# Patient Record
Sex: Male | Born: 2000
Health system: Southern US, Community
[De-identification: ages and names within clinical notes are randomized; demographics above are authoritative.]

## PROBLEM LIST (undated history)

## (undated) ENCOUNTER — Emergency Department (HOSPITAL_BASED_OUTPATIENT_CLINIC_OR_DEPARTMENT_OTHER): Discharge status: Absent without leave | Payer: BLUE CROSS/BLUE SHIELD

## (undated) DIAGNOSIS — J45909 Unspecified asthma, uncomplicated: Secondary | ICD-10-CM

## (undated) DIAGNOSIS — K4090 Unilateral inguinal hernia, without obstruction or gangrene, not specified as recurrent: Secondary | ICD-10-CM

---
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
## 2019-03-05 ENCOUNTER — Telehealth: Payer: Self-pay | Admitting: Adult Health

## 2019-03-05 MED ORDER — ALBUTEROL SULFATE HFA 108 (90 BASE) MCG/ACT IN AERS: INHALATION_SPRAY | RESPIRATORY_TRACT | 5 refills | Status: DC

## 2019-03-05 NOTE — Telephone Encounter (Signed)
Document created under Parrett NP's name in error Patient belongs to Dr Sandrea Hughs Received call from patient's mother Steward Drone requesting refill on patient's Proair [facility] [place], [address] Refills sent

## 2019-06-06 ENCOUNTER — Telehealth: Payer: Self-pay | Admitting: Adult Health

## 2019-06-06 MED ORDER — BUDESONIDE-FORMOTEROL FUMARATE 160-4.5 MCG/ACT IN AERO: 2.0000 | INHALATION_SPRAY | Freq: Two times a day (BID) | RESPIRATORY_TRACT | 1 refills | Status: DC

## 2019-06-06 NOTE — Telephone Encounter (Signed)
Pt mother called, needs refill of symbicort  Doing well .  Last seen 06/2018 ,  Advised will refill x 2 , needs ov in late July , early August with Dr. Melvyn Novas     Please refill Symbicort x 2  Set up visit with Dr. Melvyn Novas

## 2019-06-06 NOTE — Telephone Encounter (Signed)
Spoke with pt's mom and she states the pt now lives in [place] and wanted to know if he could do a televisit. I advised her that eventually he would need to be seen in the office and may have to establish care in [place]. SHe has to verify the pharmacy with the pt but will have to call me back with that information. Will await a return call.

## 2019-06-06 NOTE — Telephone Encounter (Signed)
Mom called back to give pharmacy . I sent in Symbicort to his pharmacy.  [facility] [address] [phone]  She will call back to make a televisit when she speaks to pt.

## 2019-06-19 ENCOUNTER — Telehealth: Payer: Self-pay | Admitting: Adult Health

## 2019-06-19 ENCOUNTER — Other Ambulatory Visit: Payer: Self-pay | Admitting: Adult Health

## 2019-06-19 MED ORDER — BUDESONIDE-FORMOTEROL FUMARATE 160-4.5 MCG/ACT IN AERO: 2.0000 | INHALATION_SPRAY | Freq: Two times a day (BID) | RESPIRATORY_TRACT | 0 refills | Status: DC

## 2019-06-19 NOTE — Telephone Encounter (Signed)
Refill of symbicort , need 3 month supply for insurance purposes .

## 2019-06-22 ENCOUNTER — Telehealth: Payer: Self-pay | Admitting: Adult Health

## 2019-06-22 MED ORDER — BUDESONIDE-FORMOTEROL FUMARATE 160-4.5 MCG/ACT IN AERO: 2.0000 | INHALATION_SPRAY | Freq: Two times a day (BID) | RESPIRATORY_TRACT | 0 refills | Status: DC

## 2019-06-22 NOTE — Telephone Encounter (Signed)
Call from mother 3 month supply did not go thru at pharmacy , will resend

## 2019-08-20 ENCOUNTER — Ambulatory Visit: Payer: BLUE CROSS/BLUE SHIELD | Admitting: Adult Health

## 2019-08-20 ENCOUNTER — Encounter: Payer: Self-pay | Admitting: Adult Health

## 2019-08-20 ENCOUNTER — Other Ambulatory Visit: Payer: Self-pay

## 2019-08-20 DIAGNOSIS — J309 Allergic rhinitis, unspecified: Secondary | ICD-10-CM | POA: Insufficient documentation

## 2019-08-20 DIAGNOSIS — J302 Other seasonal allergic rhinitis: Secondary | ICD-10-CM

## 2019-08-20 DIAGNOSIS — Z23 Encounter for immunization: Secondary | ICD-10-CM

## 2019-08-20 DIAGNOSIS — J4540 Moderate persistent asthma, uncomplicated: Secondary | ICD-10-CM | POA: Diagnosis not present

## 2019-08-20 MED ORDER — BUDESONIDE-FORMOTEROL FUMARATE 160-4.5 MCG/ACT IN AERO: 2.0000 | INHALATION_SPRAY | Freq: Two times a day (BID) | RESPIRATORY_TRACT | 4 refills | Status: DC

## 2019-08-20 MED ORDER — ALBUTEROL SULFATE HFA 108 (90 BASE) MCG/ACT IN AERS: INHALATION_SPRAY | RESPIRATORY_TRACT | 4 refills | Status: DC

## 2019-08-20 NOTE — Patient Instructions (Addendum)
Continue on Symbicort 2 puffs Twice daily  , rinse after use. Zyrtec 10mg  At bedtime  As needed  Drainage .  ProAir 2 puffs every 4hrs as needed wheezing .  Follow up in 1 year with Dr. Melvyn Novas  Or Kerney Hopfensperger NP and As needed

## 2019-08-20 NOTE — Assessment & Plan Note (Signed)
Continue trigger prevention.  May use Zyrtec daily as needed  Plan Patient Instructions  Continue on Symbicort 2 puffs Twice daily  , rinse after use. Zyrtec 10mg  At bedtime  As needed  Drainage .  ProAir 2 puffs every 4hrs as needed wheezing .  Follow up in 1 year with Dr. Melvyn Novas  Or Azaleah Usman NP and As needed

## 2019-08-20 NOTE — Progress Notes (Signed)
_0  ID: [name], male    DOB: August 04, 2001, 18 y.o.   MRN: [id number]  Chief Complaint  Patient presents with  . Follow-up    Asthma     Referring provider: Alfonse Ras, MD  HPI: 18 year old male never smoker followed for moderate persistent asthma.  Initial pulmonary consult July 25, 2018  Childhood asthma history  TEST/EVENTS :  Spirometry 07/25/2018  FEV1 3/87 (87%)  Ratio 79 s curvature s rx prior - FENO 07/25/2018  =   116   08/20/2019 Follow up : Asthma  Patient returns for a one-year follow-up.  Patient has underlying moderate persistent asthma.  He is a never smoker.   He has had asthma since early childhood.  Patient remains on Symbicort twice daily.  Says since starting this he is doing better he has no nocturnal cough anymore.  Activity tolerance is good.  He is able to exercise.  Patient says he has been doing well without any flare of cough and wheezing.  Patient has moved to [place].  Says he climate there is better for his breathing.  Has rare albuterol use.   No Known Allergies  Immunization History  Administered Date(s) Administered  . DTaP 03/22/2000, 05/25/2000, 08/19/2000, 10/03/2001, 02/17/2005  . HPV Quadrivalent 10/18/2013, 01/29/2014, 09/24/2014  . Hepatitis A 08/13/2015, 10/27/2016  . Hepatitis B Sep [birth year], 2000, 02/22/2000, 08/19/2000  . HiB (PRP-T) 03/22/2000, 05/25/2000, 08/19/2000, 11/10/2001  . IPV 03/22/2000, 05/25/2000, 10/03/2001, 02/17/2005  . Influenza,inj,Quad PF,6+ Mos 10/27/2016  . Influenza-Unspecified 11/07/2000, 11/10/2001, 11/05/2015  . MMR 03/17/2001, 02/17/2005  . Meningococcal Conjugate 10/27/2016  . Meningococcal Polysaccharide 09/26/2013  . Pneumococcal-Unspecified 03/22/2000, 05/25/2000, 08/19/2000, 02/15/2002  . Tdap 07/23/2011  . Varicella 03/17/2001, 10/23/2009    Past Medical History:  Diagnosis Date  . Asthma    prn inhaler  . Inguinal hernia 04/2015   left    Tobacco History: Social History   Tobacco  Use  Smoking Status Never Smoker  Smokeless Tobacco Never Used   Counseling given: Not Answered   Outpatient Medications Prior to Visit  Medication Sig Dispense Refill  . albuterol (PROAIR HFA) 108 (90 Base) MCG/ACT inhaler 2 puffs every 4 hours as needed only  if your can't catch your breath 1 Inhaler 5  . budesonide-formoterol (SYMBICORT) 160-4.5 MCG/ACT inhaler Inhale 2 puffs into the lungs 2 (two) times daily. 1 Inhaler 1  . budesonide-formoterol (SYMBICORT) 160-4.5 MCG/ACT inhaler Inhale 2 puffs into the lungs 2 (two) times daily. (Patient not taking: Reported on 08/20/2019) 3 Inhaler 0   No facility-administered medications prior to visit.      Review of Systems:   Constitutional:   No  weight loss, night sweats,  Fevers, chills, fatigue, or  lassitude.  HEENT:   No headaches,  Difficulty swallowing,  Tooth/dental problems, or  Sore throat,                No sneezing, itching, ear ache,  +nasal congestion, post nasal drip,   CV:  No chest pain,  Orthopnea, PND, swelling in lower extremities, anasarca, dizziness, palpitations, syncope.   GI  No heartburn, indigestion, abdominal pain, nausea, vomiting, diarrhea, change in bowel habits, loss of appetite, bloody stools.   Resp: No shortness of breath with exertion or at rest.  No excess mucus, no productive cough,  No non-productive cough,  No coughing up of blood.  No change in color of mucus.  No wheezing.  No chest wall deformity  Skin: no rash or lesions.  GU: no  dysuria, change in color of urine, no urgency or frequency.  No flank pain, no hematuria   MS:  No joint pain or swelling.  No decreased range of motion.  No back pain.    Physical Exam  BP 110/76 (BP Location: Left Arm, Cuff Size: Normal)   Pulse 74   Temp 98 F (36.7 C) (Oral)   Ht 5' 8" (1.727 m)   Wt 195 lb 6.4 oz (88.6 kg)   SpO2 98%   BMI 29.71 kg/m   GEN: A/Ox3; pleasant , NAD, well nourished    HEENT:  [place]/AT,  EACs-clear, TMs-wnl, NOSE-clear,  THROAT-clear, no lesions, no postnasal drip or exudate noted.   NECK:  Supple w/ fair ROM; no JVD; normal carotid impulses w/o bruits; no thyromegaly or nodules palpated; no lymphadenopathy.    RESP  Clear  P & A; w/o, wheezes/ rales/ or rhonchi. no accessory muscle use, no dullness to percussion  CARD:  RRR, no m/r/g, no peripheral edema, pulses intact, no cyanosis or clubbing.  GI:   Soft & nt; nml bowel sounds; no organomegaly or masses detected.   Musco: Warm bil, no deformities or joint swelling noted.   Neuro: alert, no focal deficits noted.    Skin: Warm, no lesions or rashes    Lab Results:  CBC No results found for: WBC, RBC, HGB, HCT, PLT, MCV, MCH, MCHC, RDW, LYMPHSABS, MONOABS, EOSABS, BASOSABS  BMET No results found for: NA, K, CL, CO2, GLUCOSE, BUN, CREATININE, CALCIUM, GFRNONAA, GFRAA  BNP No results found for: BNP  ProBNP No results found for: PROBNP  Imaging: No results found.    No flowsheet data found.  Lab Results  Component Value Date   NITRICOXIDE 116 07/25/2018        Assessment & Plan:   Chronic asthma, moderate persistent, uncomplicated Currently well controlled  Plan  Patient Instructions  Continue on Symbicort 2 puffs Twice daily  , rinse after use. Zyrtec 10mg At bedtime  As needed  Drainage .  ProAir 2 puffs every 4hrs as needed wheezing .  Follow up in 1 year with Dr. Wert  Or Parrett NP and As needed       Allergic rhinitis Continue trigger prevention.  May use Zyrtec daily as needed  Plan Patient Instructions  Continue on Symbicort 2 puffs Twice daily  , rinse after use. Zyrtec 10mg At bedtime  As needed  Drainage .  ProAir 2 puffs every 4hrs as needed wheezing .  Follow up in 1 year with Dr. Wert  Or Parrett NP and As needed          Tammy Parrett, NP 08/20/2019  

## 2019-08-20 NOTE — Assessment & Plan Note (Signed)
Currently well controlled  Plan  Patient Instructions  Continue on Symbicort 2 puffs Twice daily  , rinse after use. Zyrtec 10mg  At bedtime  As needed  Drainage .  ProAir 2 puffs every 4hrs as needed wheezing .  Follow up in 1 year with Dr. Melvyn Novas  Or Parrett NP and As needed

## 2019-08-25 NOTE — Progress Notes (Signed)
Chart and office note reviewed in detail  > agree with a/p as outlined    

## 2019-09-07 ENCOUNTER — Other Ambulatory Visit: Payer: Self-pay | Admitting: Adult Health

## 2019-10-05 DIAGNOSIS — Z20828 Contact with and (suspected) exposure to other viral communicable diseases: Secondary | ICD-10-CM | POA: Diagnosis not present

## 2020-03-05 ENCOUNTER — Telehealth (INDEPENDENT_AMBULATORY_CARE_PROVIDER_SITE_OTHER): Payer: 59 | Admitting: Adult Health

## 2020-03-05 ENCOUNTER — Encounter: Payer: Self-pay | Admitting: Adult Health

## 2020-03-05 DIAGNOSIS — J302 Other seasonal allergic rhinitis: Secondary | ICD-10-CM | POA: Diagnosis not present

## 2020-03-05 DIAGNOSIS — J069 Acute upper respiratory infection, unspecified: Secondary | ICD-10-CM

## 2020-03-05 DIAGNOSIS — J4531 Mild persistent asthma with (acute) exacerbation: Secondary | ICD-10-CM | POA: Diagnosis not present

## 2020-03-05 MED ORDER — PREDNISONE 10 MG PO TABS: ORAL_TABLET | ORAL | 0 refills | Status: DC

## 2020-03-05 MED ORDER — AZITHROMYCIN 250 MG PO TABS
ORAL_TABLET | ORAL | 0 refills | Status: AC
Start: 2020-03-05 — End: 2020-03-10

## 2020-03-05 NOTE — Progress Notes (Signed)
Virtual Visit via Video Note  I connected with [name] on 03/05/20 at 11:30 AM EST by a video enabled telemedicine application and verified that I am speaking with the correct person using two identifiers.  Location: Patient: Home  Provider: Home    I discussed the limitations of evaluation and management by telemedicine and the availability of in person appointments. The patient expressed understanding and agreed to proceed.  History of Present Illness: 19 year old male never smoker followed for mild  persistent asthma  Today's video visit is for an acute office visit.  Patient states that 4 days ago he started getting nasal congestion ear pressure, cough thick mucus and wheezing with tightness.  Had to start using his albuterol inhaler.  Patient is maintained on Symbicort but states his inhaler ran out 1 day ago.  Patient has been working/stayig  in [place]  Patient says he was around someone there in [place] the had a sore throat.  Unknown if they had COVID-19.  [facility] states [facility] had [facility] COVID-19 test that was reported as negative.  Patient denies any fever, loss of taste or smell.  No chest pain no shortness of breath no calf pain or hemoptysis.    Patient Active Problem List   Diagnosis Date Noted  . Allergic rhinitis 08/20/2019  . Chronic asthma, moderate persistent, uncomplicated 29/92/4268   Current Outpatient Medications on File Prior to Visit  Medication Sig Dispense Refill  . albuterol (PROAIR HFA) 108 (90 Base) MCG/ACT inhaler 2 puffs every 4 hours as needed only  if your can't catch your breath 24 g 4  . SYMBICORT 160-4.5 MCG/ACT inhaler INHALE 2 PUFF INTO THE LUNGS 2 TIMES DAILY 30.6 Inhaler 6   No current facility-administered medications on file prior to visit.     Observations/Objective: Patient appears well no acute distress and no audible wheezing on video  visit   Spirometry 07/25/2018  FEV1 3/87 (87%)  Ratio 79 s curvature s rx prior - FENO 07/25/2018  =   116   Assessment and Plan: Acute upper respiratory infection with mild asthma flare. COVID-19 rapid test was negative however due to acute symptoms travel to [place] and possible exposure to sick contact .  Recommend him to get retested for COVID-19 Also recommend to self quarantine until test results are returned  Asthma -Symbicort refill was given continue on current regimen.  Would add in Zyrtec for possible trigger prevention .  Plan  Patient Instructions  Zpack take as directed.  Mucinex DM Twice daily  As needed  Cough/congestion  Zyrtec 10mg  At bedtime  For 5 days then As needed   Prednisone taper over next week  COVID 19 testing - [facility] (not [facility] ) or call us back if you want [place] to set up .  Follow up as planned with Dr. Melvyn Novas  And As needed   Please contact office for sooner follow up if symptoms do not improve or worsen or seek emergency care        Follow Up Instructions: Follow-up in 3 months and as needed   I discussed the assessment and treatment plan with the patient. The patient was provided an opportunity to ask questions and all were answered. The patient agreed with the plan and demonstrated an understanding of the instructions.   The patient was advised to call back or seek an in-person evaluation if the  symptoms worsen or if the condition fails to improve as anticipated.  I provided 22  minutes of non-face-to-face time during this encounter.   Tim Oaks, NP

## 2020-03-05 NOTE — Patient Instructions (Signed)
Zpack take as directed.  Mucinex DM Twice daily  As needed  Cough/congestion  Zyrtec 10mg  At bedtime  For 5 days then As needed   Prednisone taper over next week  COVID 19 [facility] - [facility] (not [facility] ) or call back if you want [place] to set up .  Follow up as planned with Dr. [place]  And As needed   Please contact office for sooner follow up if symptoms do not improve or worsen or seek emergency care

## 2020-08-26 ENCOUNTER — Telehealth: Payer: Self-pay | Admitting: Adult Health

## 2020-08-26 MED ORDER — BUDESONIDE-FORMOTEROL FUMARATE 160-4.5 MCG/ACT IN AERO: 2.0000 | INHALATION_SPRAY | Freq: Two times a day (BID) | RESPIRATORY_TRACT | 3 refills | Status: DC

## 2020-08-26 NOTE — Telephone Encounter (Signed)
Refill requested , sent in

## 2021-01-23 ENCOUNTER — Telehealth: Payer: Self-pay | Admitting: Adult Health

## 2021-01-23 MED ORDER — BUDESONIDE-FORMOTEROL FUMARATE 160-4.5 MCG/ACT IN AERO: 2.0000 | INHALATION_SPRAY | Freq: Two times a day (BID) | RESPIRATORY_TRACT | 3 refills | Status: DC

## 2021-01-23 MED ORDER — ALBUTEROL SULFATE HFA 108 (90 BASE) MCG/ACT IN AERS: INHALATION_SPRAY | RESPIRATORY_TRACT | 2 refills | Status: DC

## 2021-01-23 NOTE — Telephone Encounter (Signed)
Needs refills of inhalers sent to pharm , sent

## 2021-06-25 MED ORDER — BUDESONIDE-FORMOTEROL FUMARATE 160-4.5 MCG/ACT IN AERO: 2.0000 | INHALATION_SPRAY | Freq: Two times a day (BID) | RESPIRATORY_TRACT | 5 refills | Status: DC

## 2021-06-25 NOTE — Addendum Note (Signed)
Addended by: Delrae Rend on: 06/25/2021 11:52 AM   Modules accepted: Orders

## 2021-07-29 ENCOUNTER — Telehealth: Payer: Self-pay | Admitting: Adult Health

## 2021-07-29 MED ORDER — BUDESONIDE-FORMOTEROL FUMARATE 160-4.5 MCG/ACT IN AERO: 2.0000 | INHALATION_SPRAY | Freq: Two times a day (BID) | RESPIRATORY_TRACT | 3 refills | Status: DC

## 2021-07-29 MED ORDER — ALBUTEROL SULFATE HFA 108 (90 BASE) MCG/ACT IN AERS: 1.0000 | INHALATION_SPRAY | RESPIRATORY_TRACT | 2 refills | Status: DC | PRN

## 2021-07-29 NOTE — Telephone Encounter (Signed)
Refill sent to new pharm  

## 2021-09-20 ENCOUNTER — Telehealth: Payer: Self-pay | Admitting: Adult Health

## 2021-09-20 MED ORDER — PREDNISONE 10 MG PO TABS
ORAL_TABLET | ORAL | 0 refills | Status: DC
Start: 2021-09-20 — End: 2021-09-21

## 2021-09-20 NOTE — Telephone Encounter (Signed)
Called in with complaints x 1 week. Traveling from [place] due to [place] , Got exposed to water leak in bedroom , started to get cough/wheezing/sore throat . ST better but still has dry cough /wheezing . Taking symbicort and albuterol .  No fever , body aches.  Appettite is good. No n/v.   Rx Prednisone taper over next week  If not better ov .   Needs ov in few weeks with spirometry Russella Dar Vickie Epley with IgE/CBC , ? Add Singulair .  Escalate maintnence to decrease steroid use.  Patient will call when back in town from traveling.    Please contact office for sooner follow up if symptoms do not improve or worsen or seek emergency care

## 2021-09-21 ENCOUNTER — Telehealth: Payer: Self-pay | Admitting: Adult Health

## 2021-09-21 MED ORDER — PREDNISONE 10 MG PO TABS: ORAL_TABLET | ORAL | 0 refills | Status: DC

## 2021-09-21 NOTE — Telephone Encounter (Signed)
Called and needs [facility]. [facility] .

## 2022-03-02 ENCOUNTER — Other Ambulatory Visit: Payer: Self-pay | Admitting: Adult Health

## 2022-03-05 ENCOUNTER — Telehealth: Payer: Self-pay | Admitting: Adult Health

## 2022-03-05 MED ORDER — BUDESONIDE-FORMOTEROL FUMARATE 160-4.5 MCG/ACT IN AERO: 2.0000 | INHALATION_SPRAY | Freq: Two times a day (BID) | RESPIRATORY_TRACT | 5 refills | Status: DC

## 2022-03-05 MED ORDER — ALBUTEROL SULFATE HFA 108 (90 BASE) MCG/ACT IN AERS: 1.0000 | INHALATION_SPRAY | RESPIRATORY_TRACT | 3 refills | Status: DC | PRN

## 2022-03-05 NOTE — Telephone Encounter (Signed)
Refilled Symbicort and Albuterol inhaler  ?

## 2023-06-09 ENCOUNTER — Telehealth: Payer: Self-pay | Admitting: Adult Health

## 2023-06-09 MED ORDER — BUDESONIDE-FORMOTEROL FUMARATE 160-4.5 MCG/ACT IN AERO: 2.0000 | INHALATION_SPRAY | Freq: Two times a day (BID) | RESPIRATORY_TRACT | 3 refills | Status: DC

## 2023-06-09 NOTE — Telephone Encounter (Signed)
Wants refill of symbicort to mail order  Needs ov, advised to call office to make ov .

## 2023-06-22 ENCOUNTER — Other Ambulatory Visit: Payer: Self-pay | Admitting: Adult Health

## 2023-07-19 ENCOUNTER — Encounter: Payer: Self-pay | Admitting: Adult Health

## 2023-07-19 ENCOUNTER — Ambulatory Visit: Payer: 59 | Admitting: Adult Health

## 2023-07-19 VITALS — BP 126/60 | HR 82 | Temp 98.0°F | Ht 68.0 in | Wt 159.0 lb

## 2023-07-19 DIAGNOSIS — J302 Other seasonal allergic rhinitis: Secondary | ICD-10-CM | POA: Diagnosis not present

## 2023-07-19 DIAGNOSIS — J45909 Unspecified asthma, uncomplicated: Secondary | ICD-10-CM | POA: Insufficient documentation

## 2023-07-19 DIAGNOSIS — J4530 Mild persistent asthma, uncomplicated: Secondary | ICD-10-CM | POA: Diagnosis not present

## 2023-07-19 NOTE — Addendum Note (Signed)
Addended by: Delrae Rend on: 07/19/2023 02:10 PM   Modules accepted: Orders

## 2023-07-19 NOTE — Assessment & Plan Note (Signed)
Mild persistent asthma -doing well on current maintenance regimen  Trigger control  Asthma action plan  Check PFT on return   Plan  Patient Instructions  Continue on Symbicort 1-2 puffs Twice daily  , rinse after use. Zyrtec 10mg  At bedtime  As needed  Drainage .  Albuterol 1-2 puffs every 4hrs as needed wheezing .  Follow up in 1 year with PFT and As needed

## 2023-07-19 NOTE — Progress Notes (Signed)
@Patient  ID: [name], male    DOB: 2001/10/28, 22 y.o.   MRN: [id number]  Chief Complaint  Patient presents with   Follow-up    Referring provider: Chapman Moss, MD  HPI: 22 year old male never smoker followed for mild persistent asthma and allergic rhinitis   TEST/EVENTS :   07/19/2023 Follow up : Asthma and Allergic Rhinitis  Patient returns for a follow-up visit.  Last seen in March 2021.  Patient says overall asthma is doing well.  Denies flare of cough or wheezing.  Says he is very active.  Exercises every single day.  Travels back and forth between here and [place] for work.  Endorses compliance with Symbicort 1 puff twice daily.  No increased albuterol use.  Takes Zyrtec on occasion.  Says he feels good.  Being around certain dogs can flare asthma .    No Known Allergies  Immunization History  Administered Date(s) Administered   DTaP 03/22/2000, 05/25/2000, 08/19/2000, 10/03/2001, 02/17/2005   HIB (PRP-T) 03/22/2000, 05/25/2000, 08/19/2000, 11/10/2001   HPV Quadrivalent 10/18/2013, 01/29/2014, 09/24/2014   Hepatitis A 08/13/2015, 10/27/2016   Hepatitis A, Ped/Adol-2 Dose 08/13/2015, 10/27/2016   Hepatitis B 07-16-00, 02/22/2000, 08/19/2000   IPV 03/22/2000, 05/25/2000, 10/03/2001, 02/17/2005   Influenza Split 11/10/2001, 11/05/2015   Influenza,inj,Quad PF,6+ Mos 10/27/2016, 08/20/2019   Influenza,inj,Quad PF,6-35 Mos 11/07/2000   Influenza-Unspecified 11/07/2000, 11/10/2001, 11/05/2015   MMR 03/17/2001, 02/17/2005   Meningococcal Conjugate 10/27/2016   Meningococcal polysaccharide vaccine (MPSV4) 09/26/2013   Pneumococcal-Unspecified 03/22/2000, 05/25/2000, 08/19/2000, 02/15/2002   Tdap 07/23/2011   Varicella 03/17/2001, 10/23/2009    Past Medical History:  Diagnosis Date   Asthma    prn inhaler   Inguinal hernia 04/2015   left    Tobacco History: Social History   Tobacco Use  Smoking Status Never  Smokeless Tobacco Never    Counseling given: Not Answered   Outpatient Medications Prior to Visit  Medication Sig Dispense Refill   albuterol (VENTOLIN HFA) 108 (90 Base) MCG/ACT inhaler INHALE 1-2 PUFFS INTO THE LUNGS EVERY 4 HOURS AS NEEDED FOR WHEEZING OR SHORTNESS OF BREATH. 8.5 each 1   SYMBICORT 160-4.5 MCG/ACT inhaler INHALE 2 PUFFS INTO THE LUNGS TWICE A DAY 10.2 each 2   predniSONE (DELTASONE) 10 MG tablet 4 tabs for 2 days, then 3 tabs for 2 days, 2 tabs for 2 days, then 1 tab for 2 days, then stop (Patient not taking: Reported on 07/19/2023) 20 tablet 0   No facility-administered medications prior to visit.     Review of Systems:   Constitutional:   No  weight loss, night sweats,  Fevers, chills, fatigue, or  lassitude.  HEENT:   No headaches,  Difficulty swallowing,  Tooth/dental problems, or  Sore throat,                No sneezing, itching, ear ache, nasal congestion, post nasal drip,   CV:  No chest pain,  Orthopnea, PND, swelling in lower extremities, anasarca, dizziness, palpitations, syncope.   GI  No heartburn, indigestion, abdominal pain, nausea, vomiting, diarrhea, change in bowel habits, loss of appetite, bloody stools.   Resp: No shortness of breath with exertion or at rest.  No excess mucus, no productive cough,  No non-productive cough,  No coughing up of blood.  No change in color of mucus.  No wheezing.  No chest wall deformity  Skin: no rash or lesions.  GU: no dysuria, change in color of urine, no urgency or frequency.  No  flank pain, no hematuria   MS:  No joint pain or swelling.  No decreased range of motion.  No back pain.    Physical Exam  BP 126/60 (BP Location: Left Arm, Patient Position: Sitting, Cuff Size: Normal)   Pulse 82   Temp 98 F (36.7 C) (Oral)   Ht 5\' 8"  (1.727 m)   Wt 159 lb (72.1 kg)   SpO2 100%   BMI 24.18 kg/m   GEN: A/Ox3; pleasant , NAD, well nourished    HEENT:  Lane/AT,  NOSE-clear, THROAT-clear, no lesions, no postnasal drip or exudate  noted.   NECK:  Supple w/ fair ROM; no JVD; normal carotid impulses w/o bruits; no thyromegaly or nodules palpated; no lymphadenopathy.    RESP  Clear  P & A; w/o, wheezes/ rales/ or rhonchi. no accessory muscle use, no dullness to percussion  CARD:  RRR, no m/r/g, no peripheral edema, pulses intact, no cyanosis or clubbing.  GI:   Soft & nt; nml bowel sounds; no organomegaly or masses detected.   Musco: Warm bil, no deformities or joint swelling noted.   Neuro: alert, no focal deficits noted.    Skin: Warm, no lesions or rashes    Lab Results:  CBC No results found for: "WBC", "RBC", "HGB", "HCT", "PLT", "MCV", "MCH", "MCHC", "RDW", "LYMPHSABS", "MONOABS", "EOSABS", "BASOSABS"  BMET No results found for: "NA", "K", "CL", "CO2", "GLUCOSE", "BUN", "CREATININE", "CALCIUM", "GFRNONAA", "GFRAA"  BNP No results found for: "BNP"  ProBNP No results found for: "PROBNP"  Imaging: No results found.  Administration History     None           No data to display          Lab Results  Component Value Date   NITRICOXIDE 116 07/25/2018        Assessment & Plan:   Asthma Mild persistent asthma -doing well on current maintenance regimen  Trigger control  Asthma action plan  Check PFT on return   Plan  Patient Instructions  Continue on Symbicort 1-2 puffs Twice daily  , rinse after use. Zyrtec 10mg  At bedtime  As needed  Drainage .  Albuterol 1-2 puffs every 4hrs as needed wheezing .  Follow up in 1 year with PFT and As needed      Allergic rhinitis Under control -Zyrtec As needed       [name], NP 07/19/2023

## 2023-07-19 NOTE — Patient Instructions (Signed)
Continue on Symbicort 1-2 puffs Twice daily  , rinse after use. Zyrtec 10mg  At bedtime  As needed  Drainage .  Albuterol 1-2 puffs every 4hrs as needed wheezing .  Follow up in 1 year with PFT and As needed

## 2023-07-19 NOTE — Assessment & Plan Note (Signed)
Under control -Zyrtec As needed

## 2023-07-25 ENCOUNTER — Telehealth: Payer: Self-pay | Admitting: Adult Health

## 2023-07-25 MED ORDER — BUDESONIDE-FORMOTEROL FUMARATE 160-4.5 MCG/ACT IN AERO: 2.0000 | INHALATION_SPRAY | Freq: Two times a day (BID) | RESPIRATORY_TRACT | 11 refills | Status: DC

## 2023-07-25 NOTE — Telephone Encounter (Signed)
Refills sent per request.

## 2023-12-23 ENCOUNTER — Telehealth: Payer: Self-pay | Admitting: Adult Health

## 2023-12-23 MED ORDER — ALBUTEROL SULFATE (2.5 MG/3ML) 0.083% IN NEBU: 2.5000 mg | INHALATION_SOLUTION | Freq: Four times a day (QID) | RESPIRATORY_TRACT | 5 refills | Status: AC | PRN

## 2023-12-23 MED ORDER — BUDESONIDE-FORMOTEROL FUMARATE 160-4.5 MCG/ACT IN AERO: 2.0000 | INHALATION_SPRAY | Freq: Two times a day (BID) | RESPIRATORY_TRACT | 11 refills | Status: DC

## 2023-12-23 MED ORDER — ALBUTEROL SULFATE HFA 108 (90 BASE) MCG/ACT IN AERS: 1.0000 | INHALATION_SPRAY | Freq: Four times a day (QID) | RESPIRATORY_TRACT | 1 refills | Status: DC | PRN

## 2023-12-23 MED ORDER — OSELTAMIVIR PHOSPHATE 75 MG PO CAPS: 75.0000 mg | ORAL_CAPSULE | Freq: Two times a day (BID) | ORAL | 0 refills | Status: AC

## 2023-12-23 NOTE — Telephone Encounter (Signed)
1 day of Fever , body aches , cough and wheezing  Home flu test positive  Rec : Tamiflu x 5 days  Symbiicort , albuterol inhaler or nebs As needed   Symptomatic support , fluids and rest  Please contact office for sooner follow up if symptoms do not improve or worsen or seek emergency care

## 2024-06-25 ENCOUNTER — Telehealth: Payer: Self-pay | Admitting: Adult Health

## 2024-06-25 MED ORDER — ALBUTEROL SULFATE HFA 108 (90 BASE) MCG/ACT IN AERS: 1.0000 | INHALATION_SPRAY | Freq: Four times a day (QID) | RESPIRATORY_TRACT | 5 refills | Status: DC | PRN

## 2024-06-25 MED ORDER — BUDESONIDE-FORMOTEROL FUMARATE 160-4.5 MCG/ACT IN AERO: 2.0000 | INHALATION_SPRAY | Freq: Two times a day (BID) | RESPIRATORY_TRACT | 11 refills | Status: DC

## 2024-06-25 NOTE — Telephone Encounter (Signed)
 Requests inhaler refill , rx sent . Advised to keep 1 year follow up later this year as planned.

## 2024-10-15 ENCOUNTER — Telehealth: Payer: Self-pay | Admitting: Adult Health

## 2024-10-15 MED ORDER — ALBUTEROL SULFATE HFA 108 (90 BASE) MCG/ACT IN AERS: 1.0000 | INHALATION_SPRAY | Freq: Four times a day (QID) | RESPIRATORY_TRACT | 5 refills | Status: AC | PRN

## 2024-10-15 MED ORDER — BUDESONIDE-FORMOTEROL FUMARATE 160-4.5 MCG/ACT IN AERO: 2.0000 | INHALATION_SPRAY | Freq: Two times a day (BID) | RESPIRATORY_TRACT | 11 refills | Status: DC

## 2024-10-15 NOTE — Telephone Encounter (Signed)
 Patient is traveling needs refill of Symbicort  and Albuterol   Rx sent to pharm.  Advised to make ov when back from work .

## 2024-11-13 DIAGNOSIS — Z125 Encounter for screening for malignant neoplasm of prostate: Secondary | ICD-10-CM | POA: Diagnosis not present

## 2024-11-13 DIAGNOSIS — E539 Vitamin B deficiency, unspecified: Secondary | ICD-10-CM | POA: Diagnosis not present

## 2024-11-13 DIAGNOSIS — E079 Disorder of thyroid, unspecified: Secondary | ICD-10-CM | POA: Diagnosis not present

## 2024-11-13 DIAGNOSIS — E559 Vitamin D deficiency, unspecified: Secondary | ICD-10-CM | POA: Diagnosis not present

## 2024-11-13 DIAGNOSIS — G4700 Insomnia, unspecified: Secondary | ICD-10-CM | POA: Diagnosis not present

## 2024-11-13 DIAGNOSIS — R5383 Other fatigue: Secondary | ICD-10-CM | POA: Diagnosis not present

## 2024-11-13 DIAGNOSIS — Z13220 Encounter for screening for lipoid disorders: Secondary | ICD-10-CM | POA: Diagnosis not present

## 2024-11-23 ENCOUNTER — Telehealth: Payer: Self-pay | Admitting: Adult Health

## 2024-11-23 MED ORDER — BUDESONIDE-FORMOTEROL FUMARATE 160-4.5 MCG/ACT IN AERO: 2.0000 | INHALATION_SPRAY | Freq: Two times a day (BID) | RESPIRATORY_TRACT | 11 refills | Status: AC

## 2024-11-23 NOTE — Telephone Encounter (Signed)
 Rx refill sent.

## 2024-11-27 ENCOUNTER — Telehealth: Payer: Self-pay

## 2024-11-27 DIAGNOSIS — J4530 Mild persistent asthma, uncomplicated: Secondary | ICD-10-CM

## 2024-11-27 MED ORDER — TRELEGY ELLIPTA 100-62.5-25 MCG/ACT IN AEPB: 1.0000 | INHALATION_SPRAY | Freq: Every day | RESPIRATORY_TRACT | Status: AC

## 2024-11-27 NOTE — Telephone Encounter (Signed)
 Per Tammy Parrett: She has refilled his Symbicort , however she is providing a sample of Trelegy 100 until he is able to refill his Symbicort . He left his inhaler in [place], and the pharmacy will not refill the Symbicort  early.  Pt is aware and Trelegy has been placed up front. NFN.  Pt may also make a f/u appt.
